# Patient Record
Sex: Female | Born: 1965 | Race: White | Hispanic: No | Smoking: Never smoker
Health system: Southern US, Community
[De-identification: ages and names within clinical notes are randomized; demographics above are authoritative.]

## PROBLEM LIST (undated history)

## (undated) DIAGNOSIS — G4700 Insomnia, unspecified: Secondary | ICD-10-CM

## (undated) DIAGNOSIS — R42 Dizziness and giddiness: Secondary | ICD-10-CM

## (undated) DIAGNOSIS — I10 Essential (primary) hypertension: Secondary | ICD-10-CM

## (undated) DIAGNOSIS — G43909 Migraine, unspecified, not intractable, without status migrainosus: Secondary | ICD-10-CM

## (undated) DIAGNOSIS — F419 Anxiety disorder, unspecified: Secondary | ICD-10-CM

## (undated) DIAGNOSIS — R51 Headache: Secondary | ICD-10-CM

## (undated) DIAGNOSIS — I639 Cerebral infarction, unspecified: Secondary | ICD-10-CM

## (undated) DIAGNOSIS — G459 Transient cerebral ischemic attack, unspecified: Secondary | ICD-10-CM

## (undated) DIAGNOSIS — R112 Nausea with vomiting, unspecified: Secondary | ICD-10-CM

## (undated) DIAGNOSIS — Z9889 Other specified postprocedural states: Secondary | ICD-10-CM

## (undated) DIAGNOSIS — E7800 Pure hypercholesterolemia, unspecified: Secondary | ICD-10-CM

## (undated) HISTORY — PX: SHOULDER SURGERY: SHX246

## (undated) HISTORY — PX: WRIST FRACTURE SURGERY: SHX121

---
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
## 1997-07-05 ENCOUNTER — Encounter: Admission: RE | Admit: 1997-07-05 | Discharge: 1997-10-03 | Payer: Self-pay | Admitting: Family Medicine

## 1998-08-24 ENCOUNTER — Encounter: Payer: Self-pay | Admitting: Family Medicine

## 1998-08-24 ENCOUNTER — Ambulatory Visit (HOSPITAL_COMMUNITY): Admission: RE | Admit: 1998-08-24 | Discharge: 1998-08-24 | Payer: Self-pay | Admitting: Family Medicine

## 1999-01-20 DIAGNOSIS — I639 Cerebral infarction, unspecified: Secondary | ICD-10-CM

## 1999-01-20 HISTORY — DX: Cerebral infarction, unspecified: I63.9

## 1999-03-11 ENCOUNTER — Encounter: Admission: RE | Admit: 1999-03-11 | Discharge: 1999-06-09 | Payer: Self-pay | Admitting: Anesthesiology

## 1999-03-24 ENCOUNTER — Ambulatory Visit (HOSPITAL_COMMUNITY): Admission: RE | Admit: 1999-03-24 | Discharge: 1999-03-24 | Payer: Self-pay | Admitting: Neurosurgery

## 1999-03-24 ENCOUNTER — Encounter: Payer: Self-pay | Admitting: Neurosurgery

## 1999-04-26 ENCOUNTER — Encounter: Payer: Self-pay | Admitting: Neurology

## 1999-04-26 ENCOUNTER — Inpatient Hospital Stay (HOSPITAL_COMMUNITY): Admission: EM | Admit: 1999-04-26 | Discharge: 1999-05-01 | Payer: Self-pay | Admitting: Emergency Medicine

## 1999-04-28 ENCOUNTER — Encounter: Payer: Self-pay | Admitting: Neurology

## 1999-05-06 ENCOUNTER — Ambulatory Visit (HOSPITAL_COMMUNITY)
Admission: RE | Admit: 1999-05-06 | Discharge: 1999-05-06 | Payer: Self-pay | Admitting: Physical Medicine and Rehabilitation

## 1999-05-06 ENCOUNTER — Encounter: Payer: Self-pay | Admitting: Physical Medicine and Rehabilitation

## 1999-05-29 ENCOUNTER — Encounter: Payer: Self-pay | Admitting: Orthopedic Surgery

## 1999-05-29 ENCOUNTER — Ambulatory Visit (HOSPITAL_COMMUNITY): Admission: RE | Admit: 1999-05-29 | Discharge: 1999-05-29 | Payer: Self-pay | Admitting: Orthopedic Surgery

## 1999-06-02 ENCOUNTER — Ambulatory Visit (HOSPITAL_COMMUNITY): Admission: RE | Admit: 1999-06-02 | Discharge: 1999-06-02 | Payer: Self-pay | Admitting: Neurology

## 1999-06-02 ENCOUNTER — Encounter: Payer: Self-pay | Admitting: Neurology

## 1999-07-28 ENCOUNTER — Encounter: Admission: RE | Admit: 1999-07-28 | Discharge: 1999-10-26 | Payer: Self-pay | Admitting: Anesthesiology

## 1999-09-03 ENCOUNTER — Ambulatory Visit (HOSPITAL_BASED_OUTPATIENT_CLINIC_OR_DEPARTMENT_OTHER): Admission: RE | Admit: 1999-09-03 | Discharge: 1999-09-03 | Payer: Self-pay | Admitting: Orthopedic Surgery

## 2000-07-11 ENCOUNTER — Ambulatory Visit (HOSPITAL_COMMUNITY): Admission: RE | Admit: 2000-07-11 | Discharge: 2000-07-11 | Payer: Self-pay | Admitting: Neurology

## 2000-07-11 ENCOUNTER — Encounter: Payer: Self-pay | Admitting: Neurology

## 2000-09-27 ENCOUNTER — Other Ambulatory Visit: Admission: RE | Admit: 2000-09-27 | Discharge: 2000-09-27 | Payer: Self-pay | Admitting: Obstetrics and Gynecology

## 2000-11-03 ENCOUNTER — Ambulatory Visit (HOSPITAL_BASED_OUTPATIENT_CLINIC_OR_DEPARTMENT_OTHER): Admission: RE | Admit: 2000-11-03 | Discharge: 2000-11-03 | Payer: Self-pay | Admitting: Orthopedic Surgery

## 2001-04-19 ENCOUNTER — Encounter: Payer: Self-pay | Admitting: Family Medicine

## 2001-04-19 ENCOUNTER — Ambulatory Visit (HOSPITAL_COMMUNITY): Admission: RE | Admit: 2001-04-19 | Discharge: 2001-04-19 | Payer: Self-pay | Admitting: Family Medicine

## 2001-10-04 ENCOUNTER — Encounter: Payer: Self-pay | Admitting: Emergency Medicine

## 2001-10-04 ENCOUNTER — Emergency Department (HOSPITAL_COMMUNITY): Admission: EM | Admit: 2001-10-04 | Discharge: 2001-10-04 | Payer: Self-pay | Admitting: Emergency Medicine

## 2004-08-15 ENCOUNTER — Other Ambulatory Visit: Admission: RE | Admit: 2004-08-15 | Discharge: 2004-08-15 | Payer: Self-pay | Admitting: Obstetrics and Gynecology

## 2011-05-28 ENCOUNTER — Encounter (HOSPITAL_COMMUNITY): Payer: Self-pay

## 2011-06-02 NOTE — Progress Notes (Signed)
Deborah Wolfe  DICTATION # [id number] CSN# [id number]   Meriel Pica, MD 06/02/2011 10:49 AM

## 2011-06-02 NOTE — H&P (Signed)
Deborah Wolfe, PUELLO NO.:  [id number]  MEDICAL RECORD NO.:  [id number]  LOCATION:  [facility]                         FACILITY:  [facility]  PHYSICIAN:  Duke Salvia. Marcelle Overlie, M.D.DATE OF BIRTH:  12/09/1965  DATE OF ADMISSION:  05/14/2011 DATE OF DISCHARGE:                             HISTORY & PHYSICAL   CHIEF COMPLAINT:  Endometrial polyp, abnormal uterine bleeding.  HISTORY OF PRESENT ILLNESS:  A 46 year old G2, P2, prior tubal,  has a 38 and 47 year old who saw our nurse practitioner recently, complaining of some abnormal uterine bleeding.  Saline infusion ultrasound was performed May 11, 2011 in our office that showed a 6 mm intramural fibroids, very small, possible adenomyosis based on myometrial thickness and a well-defined 12 mm polyp on saline infusion.  She presents now for D and C, hysteroscopy.  This procedure including risks related to bleeding infection, other complications that may require additional surgery all reviewed with her which she understands and accepts.  PAST HISTORY:  Allergies:  None.  CURRENT MEDICATIONS:  Propranolol, baby aspirin daily, verapamil, loratadine, topiramate, pravastatin, and calcium supplements.  SURGICAL HISTORY:  Cesarean section x2 in 1994 and 1998.  FAMILY HISTORY:  Significant for heart disease, diabetes, hypertension, and lung and cervical cancer.  SOCIAL HISTORY:  Denies alcohol, tobacco, or drug use.  She is separated.  Last Pap April 27, 2011 was negative.  April 12, she had a colposcopy and biopsy that showed benign squamous mucosa.  No dysplasia noted with a negative ECC.  PHYSICAL EXAMINATION:  VITAL SIGNS:  Temp 98.2, blood pressure 110/80. HEENT:  Unremarkable. NECK:  Supple without masses. LUNGS:  Clear. CARDIOVASCULAR:  Regular rate and rhythm without murmurs, rubs, gallops. BREASTS:  Without masses. ABDOMEN:  Soft, flat, nontender. PELVIC:  Normal external genitalia.  Vagina and cervix clear.  Uterus  is mid position, normal size.  Adnexa negative. EXTREMITIES AND NEUROLOGIC:  Unremarkable.  IMPRESSION:  Abnormal uterine bleeding/endometrial polyp.  PLAN:  D and C, hysteroscopy.  Procedure and risks reviewed as above.     Austine Wiedeman M. Marcelle Overlie, M.D.     [facility]/MEDQ  D:  06/02/2011  T:  06/02/2011  Job:  [id number]

## 2011-06-04 ENCOUNTER — Other Ambulatory Visit: Payer: Self-pay | Admitting: Neurology

## 2011-06-04 DIAGNOSIS — IMO0002 Reserved for concepts with insufficient information to code with codable children: Secondary | ICD-10-CM

## 2011-06-05 ENCOUNTER — Ambulatory Visit
Admission: RE | Admit: 2011-06-05 | Discharge: 2011-06-05 | Disposition: A | Discharge status: Home / Self Care | Payer: BC Managed Care – PPO | Source: Ambulatory Visit | Attending: Neurology | Admitting: Neurology

## 2011-06-05 VITALS — BP 141/82 | HR 75

## 2011-06-05 DIAGNOSIS — H60399 Other infective otitis externa, unspecified ear: Secondary | ICD-10-CM

## 2011-06-05 DIAGNOSIS — IMO0002 Reserved for concepts with insufficient information to code with codable children: Secondary | ICD-10-CM

## 2011-06-05 NOTE — Discharge Instructions (Signed)

## 2011-06-05 NOTE — Progress Notes (Signed)
Blood drawn for labs per orders.jkl

## 2011-06-08 ENCOUNTER — Encounter (HOSPITAL_COMMUNITY): Payer: Self-pay | Admitting: *Deleted

## 2011-06-11 LAB — CSF PANEL II
Glucose, CSF: 59 mg/dL (ref 43–76)
Total Protein, CSF: 44 mg/dL (ref 15–45)
Tube #: 3

## 2011-06-12 ENCOUNTER — Ambulatory Visit (HOSPITAL_COMMUNITY): Payer: BC Managed Care – PPO | Admitting: Anesthesiology

## 2011-06-12 ENCOUNTER — Other Ambulatory Visit: Payer: Self-pay

## 2011-06-12 ENCOUNTER — Encounter (HOSPITAL_COMMUNITY): Payer: Self-pay | Admitting: Obstetrics and Gynecology

## 2011-06-12 ENCOUNTER — Ambulatory Visit (HOSPITAL_COMMUNITY)
Admission: RE | Admit: 2011-06-12 | Discharge: 2011-06-12 | Disposition: A | Discharge status: Home / Self Care | Payer: BC Managed Care – PPO | Source: Ambulatory Visit | Attending: Obstetrics and Gynecology | Admitting: Obstetrics and Gynecology

## 2011-06-12 ENCOUNTER — Encounter (HOSPITAL_COMMUNITY)
Admission: RE | Disposition: A | Discharge status: Home / Self Care | Payer: Self-pay | Source: Ambulatory Visit | Attending: Obstetrics and Gynecology

## 2011-06-12 ENCOUNTER — Encounter (HOSPITAL_COMMUNITY): Payer: Self-pay | Admitting: Anesthesiology

## 2011-06-12 DIAGNOSIS — N949 Unspecified condition associated with female genital organs and menstrual cycle: Secondary | ICD-10-CM | POA: Insufficient documentation

## 2011-06-12 DIAGNOSIS — N840 Polyp of corpus uteri: Secondary | ICD-10-CM | POA: Insufficient documentation

## 2011-06-12 DIAGNOSIS — N938 Other specified abnormal uterine and vaginal bleeding: Secondary | ICD-10-CM | POA: Insufficient documentation

## 2011-06-12 HISTORY — DX: Essential (primary) hypertension: I10

## 2011-06-12 HISTORY — DX: Headache: R51

## 2011-06-12 HISTORY — DX: Dizziness and giddiness: R42

## 2011-06-12 HISTORY — DX: Other specified postprocedural states: R11.2

## 2011-06-12 HISTORY — DX: Insomnia, unspecified: G47.00

## 2011-06-12 HISTORY — DX: Pure hypercholesterolemia, unspecified: E78.00

## 2011-06-12 HISTORY — DX: Cerebral infarction, unspecified: I63.9

## 2011-06-12 HISTORY — DX: Nausea with vomiting, unspecified: Z98.890

## 2011-06-12 HISTORY — PX: POLYPECTOMY: SHX5525

## 2011-06-12 HISTORY — PX: HYSTEROSCOPY WITH D & C: SHX1775

## 2011-06-12 HISTORY — DX: Migraine, unspecified, not intractable, without status migrainosus: G43.909

## 2011-06-12 LAB — CBC
HCT: 40.4 % (ref 36.0–46.0)
Hemoglobin: 13.2 g/dL (ref 12.0–15.0)
MCH: 28.9 pg (ref 26.0–34.0)
MCV: 88.4 fL (ref 78.0–100.0)
Platelets: 309 10*3/uL (ref 150–400)
RBC: 4.57 MIL/uL (ref 3.87–5.11)
WBC: 11.1 10*3/uL — ABNORMAL HIGH (ref 4.0–10.5)

## 2011-06-12 SURGERY — DILATATION AND CURETTAGE /HYSTEROSCOPY
Anesthesia: General | Site: Uterus | Wound class: Clean Contaminated

## 2011-06-12 MED ORDER — CEFAZOLIN SODIUM 1-5 GM-% IV SOLN
INTRAVENOUS | Status: AC
  Administered 2011-06-12: 1 g via INTRAVENOUS
  Filled 2011-06-12: qty 50

## 2011-06-12 MED ORDER — LIDOCAINE HCL (CARDIAC) 20 MG/ML IV SOLN
INTRAVENOUS | Status: AC
  Filled 2011-06-12: qty 5

## 2011-06-12 MED ORDER — LACTATED RINGERS IV SOLN
INTRAVENOUS | Status: DC
  Administered 2011-06-12 (×2): via INTRAVENOUS

## 2011-06-12 MED ORDER — PROPOFOL 10 MG/ML IV EMUL
INTRAVENOUS | Status: DC | PRN
  Administered 2011-06-12: 200 mg via INTRAVENOUS

## 2011-06-12 MED ORDER — KETOROLAC TROMETHAMINE 30 MG/ML IJ SOLN: 15.0000 mg | Freq: Once | INTRAMUSCULAR | Status: DC | PRN

## 2011-06-12 MED ORDER — ONDANSETRON HCL 4 MG/2ML IJ SOLN: 4.0000 mg | Freq: Once | INTRAMUSCULAR | Status: DC | PRN

## 2011-06-12 MED ORDER — KETOROLAC TROMETHAMINE 30 MG/ML IJ SOLN
INTRAMUSCULAR | Status: AC
  Filled 2011-06-12: qty 1

## 2011-06-12 MED ORDER — PHENYLEPHRINE HCL 10 MG/ML IJ SOLN
INTRAMUSCULAR | Status: DC | PRN
  Administered 2011-06-12 (×2): 80 ug via INTRAVENOUS
  Administered 2011-06-12: 40 ug via INTRAVENOUS

## 2011-06-12 MED ORDER — KETOROLAC TROMETHAMINE 30 MG/ML IJ SOLN
INTRAMUSCULAR | Status: DC | PRN
  Administered 2011-06-12: 30 mg via INTRAVENOUS

## 2011-06-12 MED ORDER — FENTANYL CITRATE 0.05 MG/ML IJ SOLN
INTRAMUSCULAR | Status: AC
  Filled 2011-06-12: qty 5

## 2011-06-12 MED ORDER — MIDAZOLAM HCL 2 MG/2ML IJ SOLN
INTRAMUSCULAR | Status: AC
  Filled 2011-06-12: qty 2

## 2011-06-12 MED ORDER — ONDANSETRON HCL 4 MG/2ML IJ SOLN
INTRAMUSCULAR | Status: AC
  Filled 2011-06-12: qty 2

## 2011-06-12 MED ORDER — ONDANSETRON HCL 4 MG/2ML IJ SOLN
INTRAMUSCULAR | Status: DC | PRN
  Administered 2011-06-12: 4 mg via INTRAVENOUS

## 2011-06-12 MED ORDER — SODIUM CHLORIDE 0.9 % IR SOLN
Status: DC | PRN
  Administered 2011-06-12: 3000 mL

## 2011-06-12 MED ORDER — FENTANYL CITRATE 0.05 MG/ML IJ SOLN
INTRAMUSCULAR | Status: AC
  Administered 2011-06-12: 50 ug via INTRAVENOUS
  Filled 2011-06-12: qty 2

## 2011-06-12 MED ORDER — FENTANYL CITRATE 0.05 MG/ML IJ SOLN
25.0000 ug | INTRAMUSCULAR | Status: DC | PRN
  Administered 2011-06-12: 50 ug via INTRAVENOUS

## 2011-06-12 MED ORDER — MIDAZOLAM HCL 5 MG/5ML IJ SOLN
INTRAMUSCULAR | Status: DC | PRN
  Administered 2011-06-12: 2 mg via INTRAVENOUS

## 2011-06-12 MED ORDER — PROMETHAZINE HCL 25 MG RE SUPP: 12.5000 mg | Freq: Once | RECTAL | Status: DC | PRN

## 2011-06-12 MED ORDER — LIDOCAINE HCL 1 % IJ SOLN
INTRAMUSCULAR | Status: DC | PRN
  Administered 2011-06-12: 10 mL

## 2011-06-12 MED ORDER — MEPERIDINE HCL 25 MG/ML IJ SOLN: 6.2500 mg | INTRAMUSCULAR | Status: DC | PRN

## 2011-06-12 MED ORDER — FENTANYL CITRATE 0.05 MG/ML IJ SOLN
INTRAMUSCULAR | Status: DC | PRN
  Administered 2011-06-12: 50 ug via INTRAVENOUS
  Administered 2011-06-12: 100 ug via INTRAVENOUS

## 2011-06-12 MED ORDER — PROPOFOL 10 MG/ML IV EMUL
INTRAVENOUS | Status: AC
  Filled 2011-06-12: qty 20

## 2011-06-12 MED ORDER — PHENYLEPHRINE 40 MCG/ML (10ML) SYRINGE FOR IV PUSH (FOR BLOOD PRESSURE SUPPORT)
PREFILLED_SYRINGE | INTRAVENOUS | Status: AC
  Filled 2011-06-12: qty 5

## 2011-06-12 MED ORDER — DEXAMETHASONE SODIUM PHOSPHATE 4 MG/ML IJ SOLN
INTRAMUSCULAR | Status: DC | PRN
  Administered 2011-06-12: 10 mg via INTRAVENOUS

## 2011-06-12 MED ORDER — LIDOCAINE HCL (CARDIAC) 20 MG/ML IV SOLN
INTRAVENOUS | Status: DC | PRN
  Administered 2011-06-12: 80 mg via INTRAVENOUS

## 2011-06-12 MED ORDER — CEFAZOLIN SODIUM 1-5 GM-% IV SOLN: 1.0000 g | INTRAVENOUS | Status: DC

## 2011-06-12 MED ORDER — HYDROCODONE-IBUPROFEN 7.5-200 MG PO TABS: 1.0000 | ORAL_TABLET | Freq: Four times a day (QID) | ORAL | Status: AC | PRN

## 2011-06-12 SURGICAL SUPPLY — 16 items
BLADE INCISOR TRUC PLUS 2.9 (ABLATOR) IMPLANT
CANISTER SUCTION 2500CC (MISCELLANEOUS) ×3 IMPLANT
CATH ROBINSON RED A/P 16FR (CATHETERS) ×3 IMPLANT
CLOTH BEACON ORANGE TIMEOUT ST (SAFETY) ×3 IMPLANT
CONTAINER PREFILL 10% NBF 60ML (FORM) ×5 IMPLANT
ELECT REM PT RETURN 9FT ADLT (ELECTROSURGICAL)
ELECTRODE REM PT RTRN 9FT ADLT (ELECTROSURGICAL) IMPLANT
GLOVE BIO SURGEON STRL SZ7 (GLOVE) ×6 IMPLANT
GOWN PREVENTION PLUS LG XLONG (DISPOSABLE) ×6 IMPLANT
GOWN STRL REIN XL XLG (GOWN DISPOSABLE) ×3 IMPLANT
INCISOR TRUC PLUS BLADE 2.9 (ABLATOR) ×3
KIT HYSTEROSCOPY TRUCLEAR (ABLATOR) ×1 IMPLANT
LOOP ANGLED CUTTING 22FR (CUTTING LOOP) IMPLANT
PACK HYSTEROSCOPY LF (CUSTOM PROCEDURE TRAY) ×3 IMPLANT
TOWEL OR 17X24 6PK STRL BLUE (TOWEL DISPOSABLE) ×6 IMPLANT
WATER STERILE IRR 1000ML POUR (IV SOLUTION) ×3 IMPLANT

## 2011-06-12 NOTE — Anesthesia Preprocedure Evaluation (Addendum)
Anesthesia Evaluation  Patient identified by MRN, date of birth, ID band Patient awake    Reviewed: Allergy & Precautions, H&P , Patient's Chart, lab work & pertinent test results, reviewed documented beta blocker date and time   History of Anesthesia Complications (+) PONV  Airway Mallampati: II TM Distance: >3 FB Neck ROM: full    Dental No notable dental hx.    Pulmonary  breath sounds clear to auscultation  Pulmonary exam normal       Cardiovascular hypertension (EKG nl today), Pt. on medications and Pt. on home beta blockers Rhythm:regular Rate:Normal     Neuro/Psych  Headaches, CVA (L hemiparesis-resolved), No Residual Symptoms    GI/Hepatic   Endo/Other    Renal/GU      Musculoskeletal   Abdominal   Peds  Hematology   Anesthesia Other Findings   Reproductive/Obstetrics                        Anesthesia Physical Anesthesia Plan  ASA: II  Anesthesia Plan: General   Post-op Pain Management:    Induction: Intravenous  Airway Management Planned: LMA  Additional Equipment:   Intra-op Plan:   Post-operative Plan:   Informed Consent: I have reviewed the patients History and Physical, chart, labs and discussed the procedure including the risks, benefits and alternatives for the proposed anesthesia with the patient or authorized representative who has indicated his/her understanding and acceptance.   Dental Advisory Given  Plan Discussed with: CRNA and Surgeon  Anesthesia Plan Comments: (  Discussed  general anesthesia, including possible nausea, instrumentation of airway, sore throat,pulmonary aspiration, etc. I asked if the were any outstanding questions, or  concerns before we proceeded. )        Anesthesia Quick Evaluation

## 2011-06-12 NOTE — Anesthesia Postprocedure Evaluation (Signed)
Anesthesia Post Note  Patient: Deborah Wolfe  Procedure(s) Performed: Procedure(s) (LRB): DILATATION AND CURETTAGE /HYSTEROSCOPY (N/A) POLYPECTOMY ()  Anesthesia type: General  Patient location: PACU  Post pain: Pain level controlled  Post assessment: Post-op Vital signs reviewed  Last Vitals:  Filed Vitals:   06/12/11 0800  BP: 118/75  Pulse: 81  Temp: 36.8 C  Resp: 20    Post vital signs: Reviewed  Level of consciousness: sedated  Complications: No apparent anesthesia complicationsfj

## 2011-06-12 NOTE — Progress Notes (Signed)
The patient was re-examined with no change in status 

## 2011-06-12 NOTE — Op Note (Signed)
Preoperative diagnosis: Abnormal uterine bleeding, endometrial polyp  Postoperative diagnosis: Same  Procedure: D&C, hysteroscopy, to clear resection of endometrial polyps  Surgeon: Marcelle Overlie  Complications: None  EBL: Minimal  Specimens removed: Interatrial polyps, to pathology  Procedure and findings:  Patient taken the operating room after an adequate level of general anesthesia was obtained with the patient's legs in stirrups the perineum and vagina were prepped and draped in the usual fashion for D&C hysteroscopy. This point a timeout was conducted. The bladder had previously been drained EUA carried out uterus was midposition normal size mobile adnexa negative. Cervix was grasped with tenaculum after speculum was positioned. Paracervical block was then created by infiltrating at 3 and 9:00 submucosally 5-7 cc of 1% Xylocaine at each site after negative aspiration. The uterus was then sounded to 8 cm progressively dilated to a 27-29 Pratt dilator. Into clear hysteroscope, the 5 mm scope, was inserted under continuous flow into polyps were noted one larger than the other. The Tru Clear resector was in position and under direct guidance with irrigation suction both polyps were resected completely. At this point the fundus was free and clear there were no other abnormalities is was well-tolerated the fluid deficit was basically 0 introducer removed. Patient was doing well at that point Toradol was given IV and she went to PACU in good condition.  Dictated with [facility] Bronda Alfred M. Milana Obey.D.

## 2011-06-12 NOTE — Transfer of Care (Signed)
Immediate Anesthesia Transfer of Care Note  Patient: Deborah Wolfe  Procedure(s) Performed: Procedure(s) (LRB): DILATATION AND CURETTAGE /HYSTEROSCOPY (N/A) POLYPECTOMY ()  Patient Location: PACU  Anesthesia Type: General  Level of Consciousness: awake, alert  and oriented  Airway & Oxygen Therapy: Patient Spontanous Breathing and Patient connected to nasal cannula oxygen  Post-op Assessment: Report given to PACU RN and Post -op Vital signs reviewed and stable  Post vital signs: stable  Complications: No apparent anesthesia complications

## 2011-06-12 NOTE — Discharge Instructions (Signed)

## 2011-10-14 NOTE — H&P (Signed)
Rubie Maid  DICTATION # [id number] CSN# [id number]   Meriel Pica, MD 10/14/2011 12:34 PM

## 2011-10-14 NOTE — H&P (Signed)
Deborah Wolfe, Deborah Wolfe NO.:  [id number]  MEDICAL RECORD NO.:  [id number]  LOCATION:  [facility]                         [facility]ACILITY:  [facility]  PHYSICIAN:  Duke Salvia. Marcelle Overlie, M.D.DATE OF BIRTH:  [birth year]  DATE OF ADMISSION:  10/08/2011 DATE OF DISCHARGE:                             HISTORY & PHYSICAL   CHIEF COMPLAINT:  Menorrhagia.  HISTORY OF PRESENT ILLNESS:  A 46 year old G2, P2, prior tubal with a 38 and an 47 year old, in the spring of this year she had complaints of menorrhagia and underwent FHT that demonstrated 2 well-defined polyps with several small intramural fibroids and possible adenomyosis based on thickened endometrium.  She underwent D and C, hysteroscopy May 2013 with removal of benign endometrial polyps by pathology, did well initially but continued to experience worsening problems with menorrhagia.  We discussed a number of options including OCPs, Mirena IUD, endometrial ablation, or definitive LAVH.  She prefers definitive treatment, the hysterectomy procedure reviewed including risks related to bleeding, infection, transfusion, adjacent organ injury, possible need to complete surgery by open technique along with her expected recovery time.  PAST MEDICAL HISTORY:  Allergies:  None.  CURRENT MEDICATIONS:  Propranolol, baby aspirin, verapamil, loratadine, topiramate, pravastatin, and calcium supplements.  SURGICAL HISTORY:  Cesarean section x2 in 1994 and 1998.  FAMILY HISTORY:  Significant for heart disease, diabetes, hypertension, lung and cervical cancer.  SOCIAL HISTORY:  Denies alcohol, tobacco, or drug use.  She is separated.  Last Pap April 21, 2011 was normal.  REVIEW OF SYSTEMS:  She has had pyelonephritis in the past, history of interstitial cystitis in the past also.  Her neurologist is Dr. Sandria Manly, who has had her on baby aspirin for history of mini-stroke.  PHYSICAL EXAMINATION:  VITAL SIGNS:  Temperature 98.2, blood  pressure 120/78. HEENT:  Unremarkable. NECK:  Supple without masses. LUNGS:  Clear. CARDIOVASCULAR:  Regular rate and rhythm without murmurs, rubs, gallops. BREASTS:  Without masses. ABDOMEN:  Soft, flat, nontender. PELVIC:  Normal external genitalia.  Vagina and cervix clear.  Uterus, mid position, normal size, mobile, adnexa negative. EXTREMITIES AND NEUROLOGIC:  Unremarkable.  IMPRESSION:  Persistent menorrhagia, possible adenomyosis, small intramural fibroids noted on ultrasound.  PLAN:  LAVH procedure and risks discussed as above.     [name] M. Marcelle Overlie, M.D.     [facility]/MEDQ  D:  10/14/2011  T:  10/14/2011  Job:  [id number]

## 2011-10-16 ENCOUNTER — Encounter (HOSPITAL_COMMUNITY): Payer: Self-pay

## 2011-10-16 ENCOUNTER — Encounter (HOSPITAL_COMMUNITY)
Admission: RE | Admit: 2011-10-16 | Discharge: 2011-10-16 | Disposition: A | Discharge status: Home / Self Care | Payer: BC Managed Care – PPO | Source: Ambulatory Visit | Attending: Obstetrics and Gynecology | Admitting: Obstetrics and Gynecology

## 2011-10-16 HISTORY — DX: Anxiety disorder, unspecified: F41.9

## 2011-10-16 HISTORY — DX: Transient cerebral ischemic attack, unspecified: G45.9

## 2011-10-16 LAB — CBC
MCH: 29.2 pg (ref 26.0–34.0)
MCHC: 32.9 g/dL (ref 30.0–36.0)
RDW: 12.6 % (ref 11.5–15.5)

## 2011-10-16 LAB — SURGICAL PCR SCREEN
MRSA, PCR: NEGATIVE
Staphylococcus aureus: NEGATIVE

## 2011-10-16 NOTE — Patient Instructions (Addendum)
   Your procedure is scheduled on:  Monday October 7th  Enter through the [employer] of [facility] at: 6 am Pick up the phone at the desk and dial [phone] and inform us of your arrival.  Please call this number if you have any problems the morning of surgery: [phone]  Remember: Do not eat or drink anything after midnight on Sunday  You may take all of your medications with sips of water morning of surgery.  Do not wear jewelry, make-up, or FINGER nail polish No metal in your hair or on your body. Do not wear lotions, powders, perfumes. You may wear deodorant.  Please use your CHG wash as directed prior to surgery.  Do not shave anywhere for at least 12 hours prior to first CHG shower.  Do not bring valuables to the hospital.   Leave suitcase in the car. After Surgery it may be brought to your room. For patients being admitted to the hospital, checkout time is 11:00am the day of discharge.

## 2011-10-26 ENCOUNTER — Encounter (HOSPITAL_COMMUNITY)
Admission: RE | Disposition: A | Discharge status: Home / Self Care | Payer: Self-pay | Source: Ambulatory Visit | Attending: Obstetrics and Gynecology

## 2011-10-26 ENCOUNTER — Ambulatory Visit (HOSPITAL_COMMUNITY): Payer: BC Managed Care – PPO | Admitting: Anesthesiology

## 2011-10-26 ENCOUNTER — Encounter (HOSPITAL_COMMUNITY): Payer: Self-pay | Admitting: Anesthesiology

## 2011-10-26 ENCOUNTER — Encounter (HOSPITAL_COMMUNITY): Payer: Self-pay | Admitting: *Deleted

## 2011-10-26 ENCOUNTER — Ambulatory Visit (HOSPITAL_COMMUNITY)
Admission: RE | Admit: 2011-10-26 | Discharge: 2011-10-27 | Disposition: A | Discharge status: Home / Self Care | Payer: BC Managed Care – PPO | Source: Ambulatory Visit | Attending: Obstetrics and Gynecology | Admitting: Obstetrics and Gynecology

## 2011-10-26 ENCOUNTER — Encounter (HOSPITAL_COMMUNITY): Payer: Self-pay

## 2011-10-26 DIAGNOSIS — D251 Intramural leiomyoma of uterus: Secondary | ICD-10-CM | POA: Insufficient documentation

## 2011-10-26 DIAGNOSIS — N8000 Endometriosis of the uterus, unspecified: Secondary | ICD-10-CM | POA: Insufficient documentation

## 2011-10-26 DIAGNOSIS — N946 Dysmenorrhea, unspecified: Secondary | ICD-10-CM | POA: Insufficient documentation

## 2011-10-26 DIAGNOSIS — N920 Excessive and frequent menstruation with regular cycle: Secondary | ICD-10-CM | POA: Insufficient documentation

## 2011-10-26 DIAGNOSIS — D250 Submucous leiomyoma of uterus: Secondary | ICD-10-CM | POA: Insufficient documentation

## 2011-10-26 HISTORY — PX: LAPAROSCOPIC ASSISTED VAGINAL HYSTERECTOMY: SHX5398

## 2011-10-26 LAB — PREGNANCY, URINE: Preg Test, Ur: NEGATIVE

## 2011-10-26 SURGERY — HYSTERECTOMY, VAGINAL, LAPAROSCOPY-ASSISTED
Anesthesia: General | Site: Abdomen | Wound class: Clean Contaminated

## 2011-10-26 MED ORDER — NEOSTIGMINE METHYLSULFATE 1 MG/ML IJ SOLN
INTRAMUSCULAR | Status: AC
  Filled 2011-10-26: qty 10

## 2011-10-26 MED ORDER — IBUPROFEN 800 MG PO TABS
800.0000 mg | ORAL_TABLET | Freq: Three times a day (TID) | ORAL | Status: DC | PRN
  Administered 2011-10-27: 800 mg via ORAL
  Filled 2011-10-26: qty 1

## 2011-10-26 MED ORDER — ACETAMINOPHEN 10 MG/ML IV SOLN
1000.0000 mg | Freq: Once | INTRAVENOUS | Status: AC
  Administered 2011-10-26 (×2): 1000 mg via INTRAVENOUS

## 2011-10-26 MED ORDER — CEFAZOLIN SODIUM-DEXTROSE 2-3 GM-% IV SOLR
2.0000 g | INTRAVENOUS | Status: AC
  Administered 2011-10-26: 2 g via INTRAVENOUS

## 2011-10-26 MED ORDER — KETOROLAC TROMETHAMINE 30 MG/ML IJ SOLN
30.0000 mg | Freq: Once | INTRAMUSCULAR | Status: DC
  Filled 2011-10-26 (×3): qty 1

## 2011-10-26 MED ORDER — GLYCOPYRROLATE 0.2 MG/ML IJ SOLN
INTRAMUSCULAR | Status: DC | PRN
  Administered 2011-10-26: 0.4 mg via INTRAVENOUS

## 2011-10-26 MED ORDER — NALOXONE HCL 0.4 MG/ML IJ SOLN: 0.4000 mg | INTRAMUSCULAR | Status: DC | PRN

## 2011-10-26 MED ORDER — KETOROLAC TROMETHAMINE 30 MG/ML IJ SOLN: 30.0000 mg | Freq: Four times a day (QID) | INTRAMUSCULAR | Status: DC

## 2011-10-26 MED ORDER — DEXTROSE IN LACTATED RINGERS 5 % IV SOLN
INTRAVENOUS | Status: DC
  Administered 2011-10-26 (×2): via INTRAVENOUS

## 2011-10-26 MED ORDER — SCOPOLAMINE 1 MG/3DAYS TD PT72
MEDICATED_PATCH | TRANSDERMAL | Status: AC
  Administered 2011-10-26: 1.5 mg via TRANSDERMAL
  Filled 2011-10-26: qty 1

## 2011-10-26 MED ORDER — OXYCODONE-ACETAMINOPHEN 5-325 MG PO TABS
1.0000 | ORAL_TABLET | ORAL | Status: DC | PRN
  Administered 2011-10-27: 2 via ORAL
  Filled 2011-10-26 (×2): qty 2

## 2011-10-26 MED ORDER — LACTATED RINGERS IR SOLN
Status: DC | PRN
  Administered 2011-10-26: 3000 mL

## 2011-10-26 MED ORDER — BUPIVACAINE HCL (PF) 0.25 % IJ SOLN
INTRAMUSCULAR | Status: DC | PRN
  Administered 2011-10-26: 5 mL

## 2011-10-26 MED ORDER — GLYCOPYRROLATE 0.2 MG/ML IJ SOLN
INTRAMUSCULAR | Status: AC
  Filled 2011-10-26: qty 2

## 2011-10-26 MED ORDER — DEXAMETHASONE SODIUM PHOSPHATE 4 MG/ML IJ SOLN
INTRAMUSCULAR | Status: DC | PRN
  Administered 2011-10-26: 10 mg via INTRAVENOUS

## 2011-10-26 MED ORDER — HYDROMORPHONE HCL PF 1 MG/ML IJ SOLN
INTRAMUSCULAR | Status: AC
  Administered 2011-10-26: 0.5 mg via INTRAVENOUS
  Filled 2011-10-26: qty 1

## 2011-10-26 MED ORDER — VERAPAMIL HCL 40 MG PO TABS
40.0000 mg | ORAL_TABLET | Freq: Three times a day (TID) | ORAL | Status: DC
  Filled 2011-10-26 (×3): qty 1

## 2011-10-26 MED ORDER — SODIUM CHLORIDE 0.9 % IJ SOLN: 9.0000 mL | INTRAMUSCULAR | Status: DC | PRN

## 2011-10-26 MED ORDER — PROPOFOL 10 MG/ML IV EMUL
INTRAVENOUS | Status: DC | PRN
  Administered 2011-10-26: 150 mg via INTRAVENOUS

## 2011-10-26 MED ORDER — ONDANSETRON HCL 4 MG PO TABS: 4.0000 mg | ORAL_TABLET | Freq: Four times a day (QID) | ORAL | Status: DC | PRN

## 2011-10-26 MED ORDER — LIDOCAINE HCL (CARDIAC) 20 MG/ML IV SOLN
INTRAVENOUS | Status: DC | PRN
  Administered 2011-10-26: 50 mg via INTRAVENOUS

## 2011-10-26 MED ORDER — ROCURONIUM BROMIDE 100 MG/10ML IV SOLN
INTRAVENOUS | Status: DC | PRN
  Administered 2011-10-26: 40 mg via INTRAVENOUS

## 2011-10-26 MED ORDER — DIPHENHYDRAMINE HCL 50 MG/ML IJ SOLN: 12.5000 mg | Freq: Four times a day (QID) | INTRAMUSCULAR | Status: DC | PRN

## 2011-10-26 MED ORDER — MORPHINE SULFATE (PF) 1 MG/ML IV SOLN
INTRAVENOUS | Status: DC
  Administered 2011-10-26: 11:00:00 via INTRAVENOUS
  Filled 2011-10-26: qty 25

## 2011-10-26 MED ORDER — SIMVASTATIN 40 MG PO TABS
40.0000 mg | ORAL_TABLET | Freq: Every day | ORAL | Status: DC
  Filled 2011-10-26: qty 1

## 2011-10-26 MED ORDER — ONDANSETRON HCL 4 MG/2ML IJ SOLN: 4.0000 mg | Freq: Four times a day (QID) | INTRAMUSCULAR | Status: DC | PRN

## 2011-10-26 MED ORDER — KETOROLAC TROMETHAMINE 30 MG/ML IJ SOLN: 15.0000 mg | Freq: Once | INTRAMUSCULAR | Status: DC | PRN

## 2011-10-26 MED ORDER — EPHEDRINE SULFATE 50 MG/ML IJ SOLN
INTRAMUSCULAR | Status: DC | PRN
  Administered 2011-10-26 (×5): 10 mg via INTRAVENOUS

## 2011-10-26 MED ORDER — TOPIRAMATE 100 MG PO TABS
100.0000 mg | ORAL_TABLET | Freq: Every day | ORAL | Status: DC
  Administered 2011-10-26: 100 mg via ORAL
  Filled 2011-10-26: qty 1

## 2011-10-26 MED ORDER — KETOROLAC TROMETHAMINE 30 MG/ML IJ SOLN
30.0000 mg | Freq: Four times a day (QID) | INTRAMUSCULAR | Status: DC
  Administered 2011-10-26 – 2011-10-27 (×2): 30 mg via INTRAVENOUS

## 2011-10-26 MED ORDER — MIDAZOLAM HCL 2 MG/2ML IJ SOLN
INTRAMUSCULAR | Status: AC
  Filled 2011-10-26: qty 2

## 2011-10-26 MED ORDER — DEXAMETHASONE SODIUM PHOSPHATE 10 MG/ML IJ SOLN
INTRAMUSCULAR | Status: AC
  Filled 2011-10-26: qty 1

## 2011-10-26 MED ORDER — DIPHENHYDRAMINE HCL 12.5 MG/5ML PO ELIX: 12.5000 mg | ORAL_SOLUTION | Freq: Four times a day (QID) | ORAL | Status: DC | PRN

## 2011-10-26 MED ORDER — FENTANYL CITRATE 0.05 MG/ML IJ SOLN
INTRAMUSCULAR | Status: AC
  Filled 2011-10-26: qty 5

## 2011-10-26 MED ORDER — FENTANYL CITRATE 0.05 MG/ML IJ SOLN
INTRAMUSCULAR | Status: DC | PRN
  Administered 2011-10-26: 150 ug via INTRAVENOUS
  Administered 2011-10-26: 100 ug via INTRAVENOUS

## 2011-10-26 MED ORDER — ONDANSETRON HCL 4 MG/2ML IJ SOLN
INTRAMUSCULAR | Status: AC
  Filled 2011-10-26: qty 2

## 2011-10-26 MED ORDER — DEXTROSE IN LACTATED RINGERS 5 % IV SOLN: INTRAVENOUS | Status: DC

## 2011-10-26 MED ORDER — NEOSTIGMINE METHYLSULFATE 1 MG/ML IJ SOLN
INTRAMUSCULAR | Status: DC | PRN
  Administered 2011-10-26: 3 mg via INTRAVENOUS

## 2011-10-26 MED ORDER — ZOLPIDEM TARTRATE 5 MG PO TABS: 5.0000 mg | ORAL_TABLET | Freq: Every evening | ORAL | Status: DC | PRN

## 2011-10-26 MED ORDER — CEFAZOLIN SODIUM-DEXTROSE 2-3 GM-% IV SOLR
INTRAVENOUS | Status: AC
  Filled 2011-10-26: qty 50

## 2011-10-26 MED ORDER — KETOROLAC TROMETHAMINE 30 MG/ML IJ SOLN
INTRAMUSCULAR | Status: DC | PRN
  Administered 2011-10-26: 30 mg via INTRAVENOUS

## 2011-10-26 MED ORDER — HYDROMORPHONE 0.3 MG/ML IV SOLN
INTRAVENOUS | Status: DC
  Administered 2011-10-26: 1.19 mg via INTRAVENOUS
  Administered 2011-10-26: 12:00:00 via INTRAVENOUS
  Administered 2011-10-26: 0.2 mg via INTRAVENOUS
  Filled 2011-10-26: qty 25

## 2011-10-26 MED ORDER — HYDROMORPHONE HCL PF 1 MG/ML IJ SOLN
0.2500 mg | INTRAMUSCULAR | Status: DC | PRN
  Administered 2011-10-26 (×2): 0.5 mg via INTRAVENOUS

## 2011-10-26 MED ORDER — ACETAMINOPHEN 10 MG/ML IV SOLN
INTRAVENOUS | Status: AC
  Administered 2011-10-26: 1000 mg via INTRAVENOUS
  Filled 2011-10-26: qty 100

## 2011-10-26 MED ORDER — ALPRAZOLAM 0.25 MG PO TABS: 0.2500 mg | ORAL_TABLET | Freq: Every day | ORAL | Status: DC | PRN

## 2011-10-26 MED ORDER — ROCURONIUM BROMIDE 50 MG/5ML IV SOLN
INTRAVENOUS | Status: AC
  Filled 2011-10-26: qty 1

## 2011-10-26 MED ORDER — PROPOFOL 10 MG/ML IV EMUL
INTRAVENOUS | Status: AC
  Filled 2011-10-26: qty 20

## 2011-10-26 MED ORDER — SCOPOLAMINE 1 MG/3DAYS TD PT72
1.0000 | MEDICATED_PATCH | TRANSDERMAL | Status: DC
  Administered 2011-10-26: 1.5 mg via TRANSDERMAL

## 2011-10-26 MED ORDER — ONDANSETRON HCL 4 MG/2ML IJ SOLN
INTRAMUSCULAR | Status: DC | PRN
  Administered 2011-10-26: 4 mg via INTRAVENOUS

## 2011-10-26 MED ORDER — SODIUM CHLORIDE 0.9 % IJ SOLN
INTRAMUSCULAR | Status: DC | PRN
  Administered 2011-10-26: 10 mL

## 2011-10-26 MED ORDER — BUTORPHANOL TARTRATE 1 MG/ML IJ SOLN: 1.0000 mg | INTRAMUSCULAR | Status: DC | PRN

## 2011-10-26 MED ORDER — LACTATED RINGERS IV SOLN
INTRAVENOUS | Status: DC
  Administered 2011-10-26: 08:00:00 via INTRAVENOUS
  Administered 2011-10-26: 125 mL/h via INTRAVENOUS

## 2011-10-26 MED ORDER — PROPRANOLOL HCL 40 MG PO TABS
40.0000 mg | ORAL_TABLET | Freq: Two times a day (BID) | ORAL | Status: DC
  Administered 2011-10-26: 40 mg via ORAL
  Filled 2011-10-26 (×2): qty 1

## 2011-10-26 MED ORDER — BUPIVACAINE HCL (PF) 0.25 % IJ SOLN
INTRAMUSCULAR | Status: AC
  Filled 2011-10-26: qty 30

## 2011-10-26 MED ORDER — MIDAZOLAM HCL 5 MG/5ML IJ SOLN
INTRAMUSCULAR | Status: DC | PRN
  Administered 2011-10-26: 2 mg via INTRAVENOUS

## 2011-10-26 MED ORDER — MENTHOL 3 MG MT LOZG: 1.0000 | LOZENGE | OROMUCOSAL | Status: DC | PRN

## 2011-10-26 MED ORDER — LIDOCAINE HCL (CARDIAC) 20 MG/ML IV SOLN
INTRAVENOUS | Status: AC
  Filled 2011-10-26: qty 5

## 2011-10-26 SURGICAL SUPPLY — 36 items
ADH SKN CLS APL DERMABOND .7 (GAUZE/BANDAGES/DRESSINGS) ×1
CABLE HIGH FREQUENCY MONO STRZ (ELECTRODE) IMPLANT
CATH ROBINSON RED A/P 16FR (CATHETERS) ×1 IMPLANT
CLOTH BEACON ORANGE TIMEOUT ST (SAFETY) ×2 IMPLANT
CONT PATH 16OZ SNAP LID 3702 (MISCELLANEOUS) ×2 IMPLANT
COVER TABLE BACK 60X90 (DRAPES) ×2 IMPLANT
DECANTER SPIKE VIAL GLASS SM (MISCELLANEOUS) ×2 IMPLANT
DERMABOND ADVANCED (GAUZE/BANDAGES/DRESSINGS) ×1
DERMABOND ADVANCED .7 DNX12 (GAUZE/BANDAGES/DRESSINGS) ×2 IMPLANT
ELECT LIGASURE LONG (ELECTRODE) ×2 IMPLANT
ELECT REM PT RETURN 9FT ADLT (ELECTROSURGICAL) ×2
ELECTRODE REM PT RTRN 9FT ADLT (ELECTROSURGICAL) ×1 IMPLANT
GLOVE BIO SURGEON STRL SZ7 (GLOVE) ×6 IMPLANT
GLOVE BIOGEL PI IND STRL 6.5 (GLOVE) ×1 IMPLANT
GLOVE BIOGEL PI INDICATOR 6.5 (GLOVE) ×1
GOWN PREVENTION PLUS LG XLONG (DISPOSABLE) ×9 IMPLANT
NDL INSUFFLATION 14GA 120MM (NEEDLE) ×1 IMPLANT
NEEDLE INSUFFLATION 14GA 120MM (NEEDLE) ×2 IMPLANT
NS IRRIG 1000ML POUR BTL (IV SOLUTION) ×2 IMPLANT
PACK LAVH (CUSTOM PROCEDURE TRAY) ×2 IMPLANT
PROTECTOR NERVE ULNAR (MISCELLANEOUS) ×2 IMPLANT
SEALER TISSUE G2 CVD JAW 45CM (ENDOMECHANICALS) ×2 IMPLANT
SET IRRIG TUBING LAPAROSCOPIC (IRRIGATION / IRRIGATOR) ×1 IMPLANT
SOLUTION ELECTROLUBE (MISCELLANEOUS) IMPLANT
STRIP CLOSURE SKIN 1/4X3 (GAUZE/BANDAGES/DRESSINGS) IMPLANT
SUT MON AB 2-0 CT1 36 (SUTURE) ×4 IMPLANT
SUT VIC AB 0 CT1 18XCR BRD8 (SUTURE) ×3 IMPLANT
SUT VIC AB 0 CT1 8-18 (SUTURE) ×6
SUT VICRYL 0 TIES 12 18 (SUTURE) ×2 IMPLANT
SUT VICRYL 4-0 PS2 18IN ABS (SUTURE) ×2 IMPLANT
TOWEL OR 17X24 6PK STRL BLUE (TOWEL DISPOSABLE) ×4 IMPLANT
TRAY FOLEY CATH 14FR (SET/KITS/TRAYS/PACK) ×2 IMPLANT
TROCAR Z-THREAD BLADED 11X100M (TROCAR) ×2 IMPLANT
TROCAR Z-THREAD BLADED 5X100MM (TROCAR) ×4 IMPLANT
WARMER LAPAROSCOPE (MISCELLANEOUS) ×2 IMPLANT
WATER STERILE IRR 1000ML POUR (IV SOLUTION) ×1 IMPLANT

## 2011-10-26 NOTE — Progress Notes (Signed)
The patient was re-examined with no change in status 

## 2011-10-26 NOTE — Transfer of Care (Signed)
Immediate Anesthesia Transfer of Care Note  Patient: Deborah Wolfe  Procedure(s) Performed: Procedure(s) (LRB) with comments: LAPAROSCOPIC ASSISTED VAGINAL HYSTERECTOMY (N/A)  Patient Location: PACU  Anesthesia Type: General  Level of Consciousness: awake, alert  and oriented  Airway & Oxygen Therapy: Patient Spontanous Breathing and Patient connected to nasal cannula oxygen  Post-op Assessment: Report given to PACU RN and Post -op Vital signs reviewed and stable  Post vital signs: Reviewed and stable  Complications: No apparent anesthesia complications

## 2011-10-26 NOTE — Anesthesia Preprocedure Evaluation (Signed)
Anesthesia Evaluation  Patient identified by MRN, date of birth, ID band Patient awake    Reviewed: Allergy & Precautions, H&P , NPO status , Patient's Chart, lab work & pertinent test results, reviewed documented beta blocker date and time   History of Anesthesia Complications (+) PONV  Airway Mallampati: II TM Distance: >3 FB Neck ROM: full    Dental  (+) Teeth Intact   Pulmonary neg pulmonary ROS,  breath sounds clear to auscultation  Pulmonary exam normal       Cardiovascular hypertension, On Medications and On Home Beta Blockers Rhythm:regular Rate:Normal     Neuro/Psych  Headaches (on topamax for migraines), vertigo 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, 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), Residual Symptoms negative psych ROS   GI/Hepatic negative GI ROS, Neg liver ROS,   Endo/Other  negative endocrine ROS  Renal/GU      Musculoskeletal   Abdominal   Peds  Hematology negative hematology ROS (+)   Anesthesia Other Findings CAUTION POSITIONING RIGHT ELBOW AND RIGHT KNEE - pain and weakness in both since last TIA in April  Reproductive/Obstetrics negative OB ROS                           Anesthesia Physical Anesthesia Plan  ASA: III  Anesthesia Plan: General ETT   Post-op Pain Management:    Induction:   Airway Management Planned:   Additional Equipment:   Intra-op Plan:   Post-operative Plan:   Informed Consent: I have reviewed the patients History and Physical, chart, labs and discussed the procedure including the risks, benefits and alternatives for the proposed anesthesia with the patient or authorized representative who has indicated his/her understanding and acceptance.   Dental Advisory Given  Plan Discussed with: CRNA and Surgeon  Anesthesia Plan Comments:         Anesthesia Quick Evaluation

## 2011-10-26 NOTE — Anesthesia Procedure Notes (Signed)
Procedure Name: Intubation Date/Time: 10/26/2011 7:36 AM Performed by: Shanon Payor Pre-anesthesia Checklist: Suction available, Emergency Drugs available, Timeout performed, Patient identified and Patient being monitored Patient Re-evaluated:Patient Re-evaluated prior to inductionOxygen Delivery Method: Circle system utilized Preoxygenation: Pre-oxygenation with 100% oxygen Intubation Type: IV induction Ventilation: Mask ventilation without difficulty Laryngoscope Size: Mac and 3 Grade View: Grade II Tube type: Oral Tube size: 7.0 mm Number of attempts: 1 Airway Equipment and Method: Stylet Placement Confirmation: ETT inserted through vocal cords under direct vision,  positive ETCO2 and breath sounds checked- equal and bilateral Secured at: 21 cm Tube secured with: Tape Dental Injury: Teeth and Oropharynx as per pre-operative assessment

## 2011-10-26 NOTE — Anesthesia Postprocedure Evaluation (Signed)
Anesthesia Post Note  Patient: Deborah Wolfe  Procedure(s) Performed: Procedure(s) (LRB): LAPAROSCOPIC ASSISTED VAGINAL HYSTERECTOMY (N/A)  Anesthesia type: General  Patient location: PACU  Post pain: Pain level controlled  Post assessment: Post-op Vital signs reviewed  Last Vitals:  Filed Vitals:   10/26/11 0926  BP:   Pulse: 65  Temp:   Resp: 16    Post vital signs: Reviewed  Level of consciousness: sedated  Complications: No apparent anesthesia complicationsfj

## 2011-10-26 NOTE — Anesthesia Postprocedure Evaluation (Signed)
  Anesthesia Post-op Note  Patient: Deborah Wolfe  Procedure(s) Performed: Procedure(s) (LRB) with comments: LAPAROSCOPIC ASSISTED VAGINAL HYSTERECTOMY (N/A)  Patient Location: [facility]  Anesthesia Type: General  Level of Consciousness: sedated  Airway and Oxygen Therapy: Patient Spontanous Breathing  Post-op Pain: mild  Post-op Assessment: Post-op Vital signs reviewed  Post-op Vital Signs: Reviewed and stable  Complications: No apparent anesthesia complications

## 2011-10-26 NOTE — Addendum Note (Signed)
Addendum  created 10/26/11 1814 by Algis Greenhouse, CRNA   Modules edited:Notes Section

## 2011-10-26 NOTE — Op Note (Signed)
Preoperative diagnosis: Dysmenorrhea, menorrhagia  Postoperative diagnosis: Same, possible adenomyosis  Surgeon: Marcelle Overlie  Assistant: Morris  EBL: 150 cc  Specimens removed: Uterus and cervix, to pathology  Procedure and findings:  The patient taken the operating room after an adequate level of general anesthesia was obtained the patient's leg stirrups the abdomen perineum and vagina were prepped and draped in usual fashion for LAVH prior to prep appropriate timeout for taken. The bladder was drained he UA carried out uterus was midposition mobile normal size adnexa negative. Hulka tenaculum was positioned. Attention directed to the abdomen the subumbilical area was infiltrated with quarter percent Marcaine plain, small incision was made in the varies needle was introduced without difficulty. Its intra-abdominal position was verified by pressure water testing. After 2-1/2 L pneumoperitoneum syncopated lap scopic trocar and sleeve were then inserted without difficulty. There was no evidence of any bleeding or trauma 3 finger restaurant symphysis in the midline a 5 mm trocar was inserted under direct visualization and the pelvic findings as follows:  Uterus itself is upper limit normal size somewhat boggy at the fundus bilateral adnexa unremarkable there was a solitary omental adhesion into the area the right adnexa which was coagulated and divided an avascular plane. The upper abdomen was otherwise unremarkable using the in seal device the utero-ovarian pedicle on each side was coagulated and divided down to and including the round ligament on each side with excellent hemostasis. Attention directed to the vaginal portion the procedure at this point.  The legs were extended weighted speculum was positioned the cervical vaginal mucosa was incised with the Bovie posterior culdotomy performed without difficulty. Bladder was advanced superiorly until the anterior peritoneal reflection could be identified  this was entered sharply and a retractor was used to gently elevate the bladder out of the field in sequential manner, staying close to the uterus, the uterosacral ligament, cardinal ligament uterine basket or pedicles and upper broad ligament pedicles were coagulated and divided with the LigaSure device. The fundus of the uterus is in delivered posteriorly remaining pedicles were coagulated and divided. Vaginal cuff was then closed with 3 to 9:00 with a locked suture 2-0 Monocryl. Prior to closure sponge denies precast approach correct x2 vaginal mucosa was then closed right to left with interrupted 2-0 Vicryl sutures with excellent hemostasis. Foley catheter positioned draining clear urine.  Repeat laparoscopy was carried out at reduced pressure, nasi used to irrigate the pelvis which was aspirated noted be hemostatic at the operative site. Its was removed, gas less escape because closed with 4 Monocryl subcuticular at the umbilicus and Dermabond in the lower incision she tolerated this well went to recovery room in good condition.  Dictated with [facility]  Caetano Oberhaus M. Milana Obey.D.

## 2011-10-27 ENCOUNTER — Encounter (HOSPITAL_COMMUNITY): Payer: Self-pay | Admitting: Obstetrics and Gynecology

## 2011-10-27 LAB — CBC
MCH: 29.3 pg (ref 26.0–34.0)
MCHC: 33.4 g/dL (ref 30.0–36.0)
MCV: 87.7 fL (ref 78.0–100.0)
Platelets: 266 10*3/uL (ref 150–400)
RBC: 3.89 MIL/uL (ref 3.87–5.11)

## 2011-10-27 MED ORDER — TRAMADOL HCL 50 MG PO TABS: 50.0000 mg | ORAL_TABLET | Freq: Four times a day (QID) | ORAL | Status: AC | PRN

## 2011-10-27 MED ORDER — SODIUM CHLORIDE 0.9 % IJ SOLN: 3.0000 mL | Freq: Two times a day (BID) | INTRAMUSCULAR | Status: DC

## 2011-10-27 NOTE — Discharge Summary (Signed)
Physician Discharge Summary  Patient ID: Deborah Wolfe MRN: [id number] DOB/AGE: [birth year] 46 y.o.  Admit date: 10/26/2011 Discharge date: 10/27/2011  Admission Diagnoses:Menorrhagia  Discharge Diagnoses: Same Active Problems:  * No active hospital problems. *    Discharged Condition: good  Hospital Course: adm for LAVH, DC on POD #1, afeb, tol PO  Consults: None  Significant Diagnostic Studies: labs:  Results for orders placed during the hospital encounter of 10/26/11 (from the past 24 hour(s))  CBC     Status: Abnormal   Collection Time   10/27/11  5:05 AM      Component Value Range   WBC 11.4 (*) 4.0 - 10.5 K/uL   RBC 3.89  3.87 - 5.11 MIL/uL   Hemoglobin 11.4 (*) 12.0 - 15.0 g/dL   HCT 40.9 (*) 81.1 - 91.4 %   MCV 87.7  78.0 - 100.0 fL   MCH 29.3  26.0 - 34.0 pg   MCHC 33.4  30.0 - 36.0 g/dL   RDW 78.2  95.6 - 21.3 %   Platelets 266  150 - 400 K/uL    Treatments: surgery: LAVH  Discharge Exam: Blood pressure 97/62, pulse 64, temperature 97.3 F (36.3 C), temperature source Oral, resp. rate 17, height 5\' 2"  (1.575 m), weight 154 lb (69.854 kg), last menstrual period 10/05/2011, SpO2 100.00%. abd soft, flat +BS, INCS c/d  Disposition: 01-Home or Self Care     Medication List     As of 10/27/2011  8:40 AM    TAKE these medications         ALPRAZolam 0.5 MG tablet   Commonly known as: XANAX   Take 0.25 mg by mouth as needed. Takes prior to procedures      aspirin 81 MG EC tablet   Take 81 mg by mouth daily. Swallow whole.      ibuprofen 800 MG tablet   Commonly known as: ADVIL,MOTRIN   Take 800 mg by mouth every 6 (six) hours as needed. For cramps or headache      pravastatin 40 MG tablet   Commonly known as: PRAVACHOL   Take 40 mg by mouth daily.      propranolol 40 MG tablet   Commonly known as: INDERAL   Take 40 mg by mouth 2 (two) times daily.      topiramate 100 MG tablet   Commonly known as: TOPAMAX   Take 100 mg by mouth at bedtime.      traMADol 50 MG tablet   Commonly known as: ULTRAM   Take 1 tablet (50 mg total) by mouth every 6 (six) hours as needed for pain.      verapamil 80 MG tablet   Commonly known as: CALAN   Take 40 mg by mouth 3 (three) times daily.      Vitamin D 1000 UNITS capsule   Take 2,000 Units by mouth daily.           Follow-up Information    Follow up with [facility] [place], [place].. Schedule an appointment as soon as possible for a visit in 1 week.   Contact information:   [address] [place] [address] [phone]         Signed: Meriel Pica 10/27/2011, 8:40 AM

## 2011-10-27 NOTE — Progress Notes (Signed)
POD #1  S// no c/o, ready for D/C  O//  BP 97/62  Pulse 64  Temp 97.3 F (36.3 C) (Oral)  Resp 17  Ht 5\' 2"  (1.575 m)  Wt 154 lb (69.854 kg)  BMI 28.17 kg/m2  SpO2 100%  LMP 10/05/2011 CBC    Component Value Date/Time   WBC 11.4* 10/27/2011 0505   RBC 3.89 10/27/2011 0505   HGB 11.4* 10/27/2011 0505   HCT 34.1* 10/27/2011 0505   PLT 266 10/27/2011 0505   MCV 87.7 10/27/2011 0505   MCH 29.3 10/27/2011 0505   MCHC 33.4 10/27/2011 0505   RDW 12.4 10/27/2011 0505   abd soft , flat + BS  A+P// stable , ready for DC

## 2012-04-26 ENCOUNTER — Telehealth: Payer: Self-pay

## 2012-04-26 MED ORDER — PRAVASTATIN SODIUM 40 MG PO TABS: 40.0000 mg | ORAL_TABLET | Freq: Every day | ORAL | Status: AC

## 2012-04-26 MED ORDER — VERAPAMIL HCL 80 MG PO TABS: 40.0000 mg | ORAL_TABLET | Freq: Three times a day (TID) | ORAL | Status: AC

## 2012-04-26 MED ORDER — PROPRANOLOL HCL 40 MG PO TABS: 40.0000 mg | ORAL_TABLET | Freq: Two times a day (BID) | ORAL | Status: AC

## 2012-04-26 MED ORDER — TOPIRAMATE 100 MG PO TABS: 100.0000 mg | ORAL_TABLET | Freq: Every day | ORAL | Status: AC

## 2012-04-26 NOTE — Telephone Encounter (Signed)
Former [facility] patient.  Requesting refills be sent to [facility] in [place].  Rx's sent via [facility].

## 2013-11-02 ENCOUNTER — Other Ambulatory Visit: Payer: Self-pay | Admitting: Obstetrics and Gynecology

## 2013-11-02 DIAGNOSIS — R928 Other abnormal and inconclusive findings on diagnostic imaging of breast: Secondary | ICD-10-CM

## 2013-11-27 ENCOUNTER — Ambulatory Visit
Admission: RE | Admit: 2013-11-27 | Discharge: 2013-11-27 | Disposition: A | Discharge status: Home / Self Care | Payer: BC Managed Care – PPO | Source: Ambulatory Visit | Attending: Obstetrics and Gynecology | Admitting: Obstetrics and Gynecology

## 2013-11-27 ENCOUNTER — Other Ambulatory Visit: Payer: Self-pay | Admitting: Obstetrics and Gynecology

## 2013-11-27 DIAGNOSIS — R928 Other abnormal and inconclusive findings on diagnostic imaging of breast: Secondary | ICD-10-CM

## 2013-11-27 DIAGNOSIS — N6320 Unspecified lump in the left breast, unspecified quadrant: Secondary | ICD-10-CM

## 2013-12-07 ENCOUNTER — Ambulatory Visit
Admission: RE | Admit: 2013-12-07 | Discharge: 2013-12-07 | Disposition: A | Discharge status: Home / Self Care | Payer: BC Managed Care – PPO | Source: Ambulatory Visit | Attending: Obstetrics and Gynecology | Admitting: Obstetrics and Gynecology

## 2013-12-07 ENCOUNTER — Other Ambulatory Visit: Payer: Self-pay | Admitting: Obstetrics and Gynecology

## 2013-12-07 DIAGNOSIS — N6320 Unspecified lump in the left breast, unspecified quadrant: Secondary | ICD-10-CM

## 2013-12-07 HISTORY — PX: BREAST BIOPSY: SHX20

## 2014-04-25 ENCOUNTER — Other Ambulatory Visit: Payer: Self-pay | Admitting: Obstetrics and Gynecology

## 2014-04-27 LAB — CYTOLOGY - PAP

## 2020-12-23 ENCOUNTER — Other Ambulatory Visit: Payer: Self-pay | Admitting: Obstetrics and Gynecology

## 2020-12-23 DIAGNOSIS — R928 Other abnormal and inconclusive findings on diagnostic imaging of breast: Secondary | ICD-10-CM

## 2021-02-18 ENCOUNTER — Other Ambulatory Visit: Payer: Self-pay | Admitting: Obstetrics and Gynecology

## 2021-02-18 ENCOUNTER — Ambulatory Visit
Admission: RE | Admit: 2021-02-18 | Discharge: 2021-02-18 | Disposition: A | Discharge status: Home / Self Care | Payer: Medicare (Managed Care) | Source: Ambulatory Visit | Attending: Obstetrics and Gynecology | Admitting: Obstetrics and Gynecology

## 2021-02-18 ENCOUNTER — Ambulatory Visit
Admission: RE | Admit: 2021-02-18 | Discharge: 2021-02-18 | Disposition: A | Discharge status: Home / Self Care | Payer: Self-pay | Source: Ambulatory Visit | Attending: Obstetrics and Gynecology | Admitting: Obstetrics and Gynecology

## 2021-02-18 DIAGNOSIS — R928 Other abnormal and inconclusive findings on diagnostic imaging of breast: Secondary | ICD-10-CM

## 2021-02-18 DIAGNOSIS — N6320 Unspecified lump in the left breast, unspecified quadrant: Secondary | ICD-10-CM

## 2021-02-18 DIAGNOSIS — N6489 Other specified disorders of breast: Secondary | ICD-10-CM

## 2021-05-06 ENCOUNTER — Other Ambulatory Visit: Payer: Self-pay | Admitting: Obstetrics and Gynecology

## 2021-05-06 DIAGNOSIS — N6489 Other specified disorders of breast: Secondary | ICD-10-CM

## 2021-05-06 DIAGNOSIS — N6320 Unspecified lump in the left breast, unspecified quadrant: Secondary | ICD-10-CM

## 2021-05-06 DIAGNOSIS — R928 Other abnormal and inconclusive findings on diagnostic imaging of breast: Secondary | ICD-10-CM

## 2021-05-15 ENCOUNTER — Other Ambulatory Visit: Payer: Self-pay | Admitting: Obstetrics and Gynecology

## 2021-05-15 ENCOUNTER — Ambulatory Visit
Admission: RE | Admit: 2021-05-15 | Discharge: 2021-05-15 | Disposition: A | Discharge status: Home / Self Care | Payer: Medicare (Managed Care) | Source: Ambulatory Visit | Attending: Obstetrics and Gynecology | Admitting: Obstetrics and Gynecology

## 2021-05-15 ENCOUNTER — Ambulatory Visit
Admission: RE | Admit: 2021-05-15 | Discharge: 2021-05-15 | Disposition: A | Discharge status: Home / Self Care | Payer: Medicaid Other | Source: Ambulatory Visit | Attending: Obstetrics and Gynecology | Admitting: Obstetrics and Gynecology

## 2021-05-15 DIAGNOSIS — N6489 Other specified disorders of breast: Secondary | ICD-10-CM

## 2021-05-15 DIAGNOSIS — N6320 Unspecified lump in the left breast, unspecified quadrant: Secondary | ICD-10-CM

## 2021-05-15 DIAGNOSIS — R928 Other abnormal and inconclusive findings on diagnostic imaging of breast: Secondary | ICD-10-CM

## 2021-05-15 HISTORY — PX: BREAST BIOPSY: SHX20

## 2021-05-26 ENCOUNTER — Ambulatory Visit
Admission: RE | Admit: 2021-05-26 | Discharge: 2021-05-26 | Disposition: A | Discharge status: Home / Self Care | Payer: 59 | Source: Ambulatory Visit | Attending: Obstetrics and Gynecology | Admitting: Obstetrics and Gynecology

## 2021-05-26 ENCOUNTER — Ambulatory Visit
Admission: RE | Admit: 2021-05-26 | Discharge: 2021-05-26 | Disposition: A | Discharge status: Home / Self Care | Payer: Medicare (Managed Care) | Source: Ambulatory Visit | Attending: Obstetrics and Gynecology | Admitting: Obstetrics and Gynecology

## 2021-05-26 DIAGNOSIS — N6320 Unspecified lump in the left breast, unspecified quadrant: Secondary | ICD-10-CM

## 2021-05-26 HISTORY — PX: BREAST BIOPSY: SHX20

## 2021-08-25 ENCOUNTER — Other Ambulatory Visit: Payer: Medicare (Managed Care)

## 2021-12-23 ENCOUNTER — Other Ambulatory Visit: Payer: 59

## 2021-12-31 ENCOUNTER — Other Ambulatory Visit: Payer: 59

## 2022-01-29 ENCOUNTER — Ambulatory Visit
Admission: RE | Admit: 2022-01-29 | Discharge: 2022-01-29 | Disposition: A | Discharge status: Home / Self Care | Payer: 59 | Source: Ambulatory Visit | Attending: Obstetrics and Gynecology | Admitting: Obstetrics and Gynecology

## 2022-01-29 DIAGNOSIS — N6489 Other specified disorders of breast: Secondary | ICD-10-CM

## 2022-01-29 DIAGNOSIS — R928 Other abnormal and inconclusive findings on diagnostic imaging of breast: Secondary | ICD-10-CM

## 2022-01-29 DIAGNOSIS — N6320 Unspecified lump in the left breast, unspecified quadrant: Secondary | ICD-10-CM

## 2024-03-31 IMAGING — MG MM BREAST LOCALIZATION CLIP
4 series · 4 of 12 positions shown · non-contrast
Comparison: Previous exam(s).

CLINICAL DATA: Patient status post stereo biopsy left breast mass.

EXAM:
3D DIAGNOSTIC LEFT MAMMOGRAM POST STEREOTACTIC BIOPSY

[L ML synth-2D]
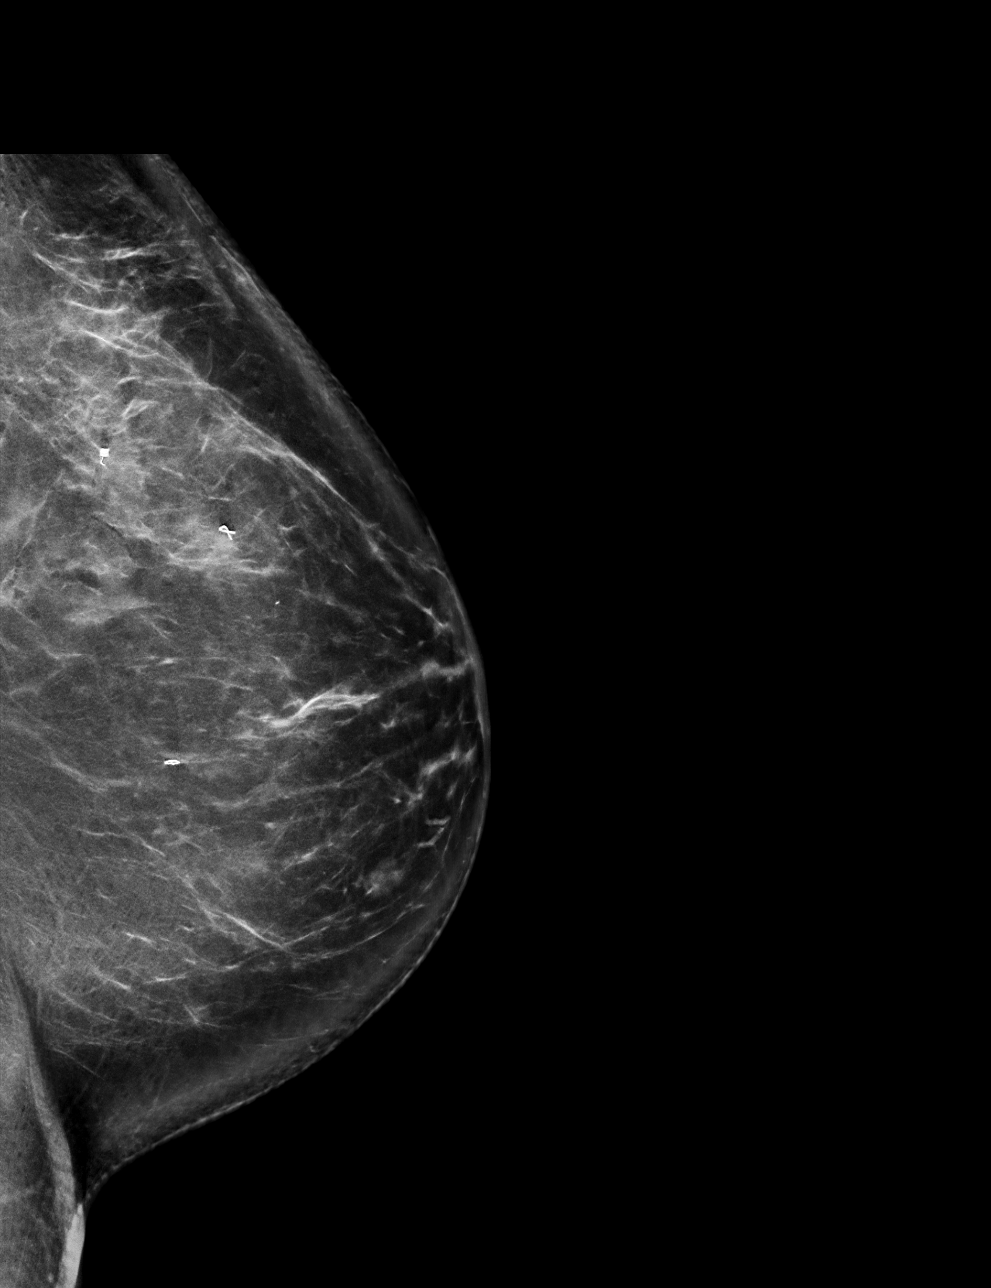

[L CC synth-2D]
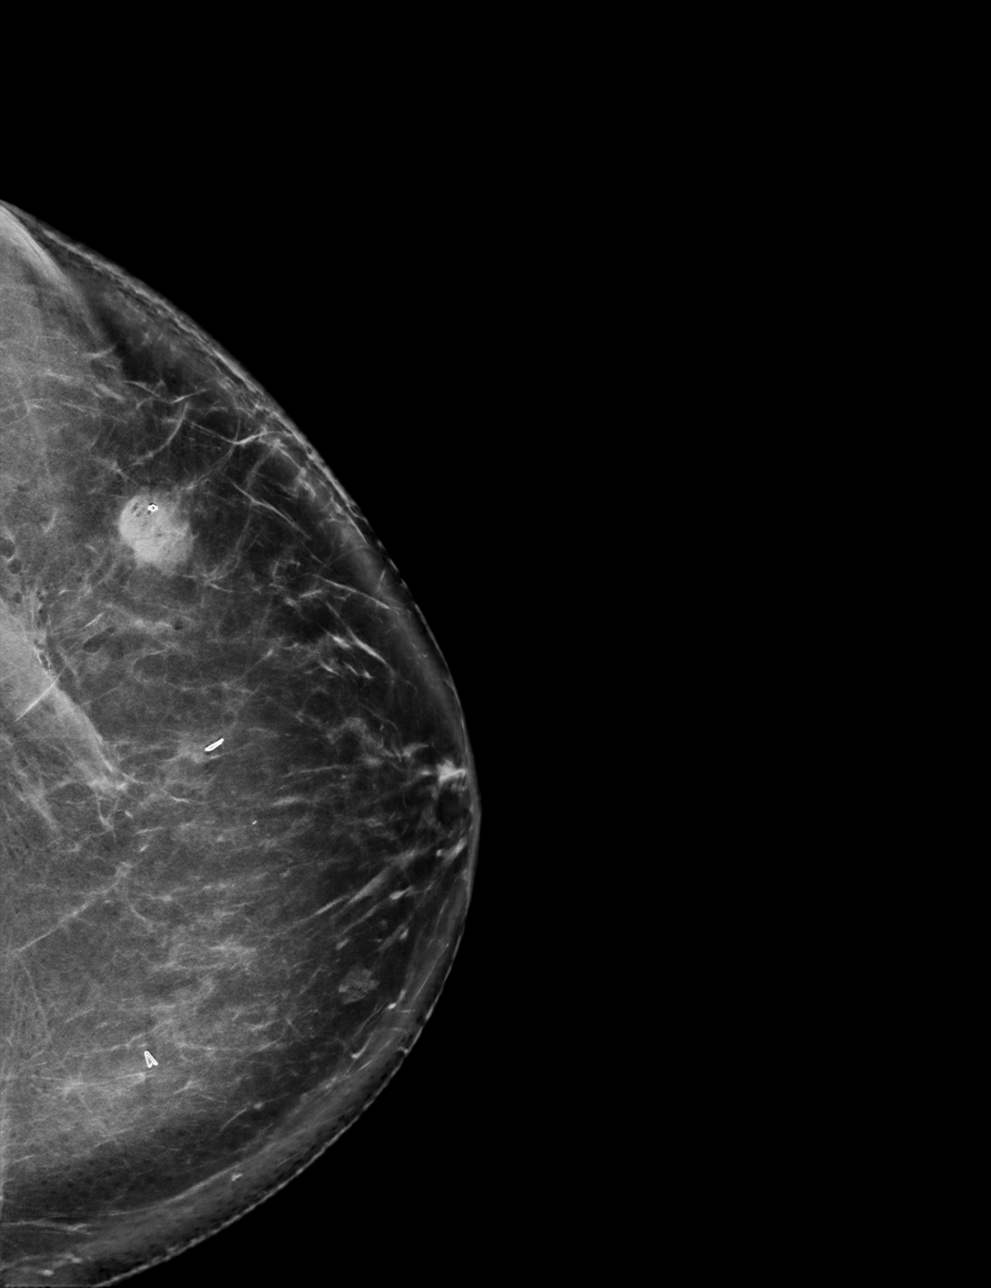

[L CC tomo · tomo slice 45/88.0]
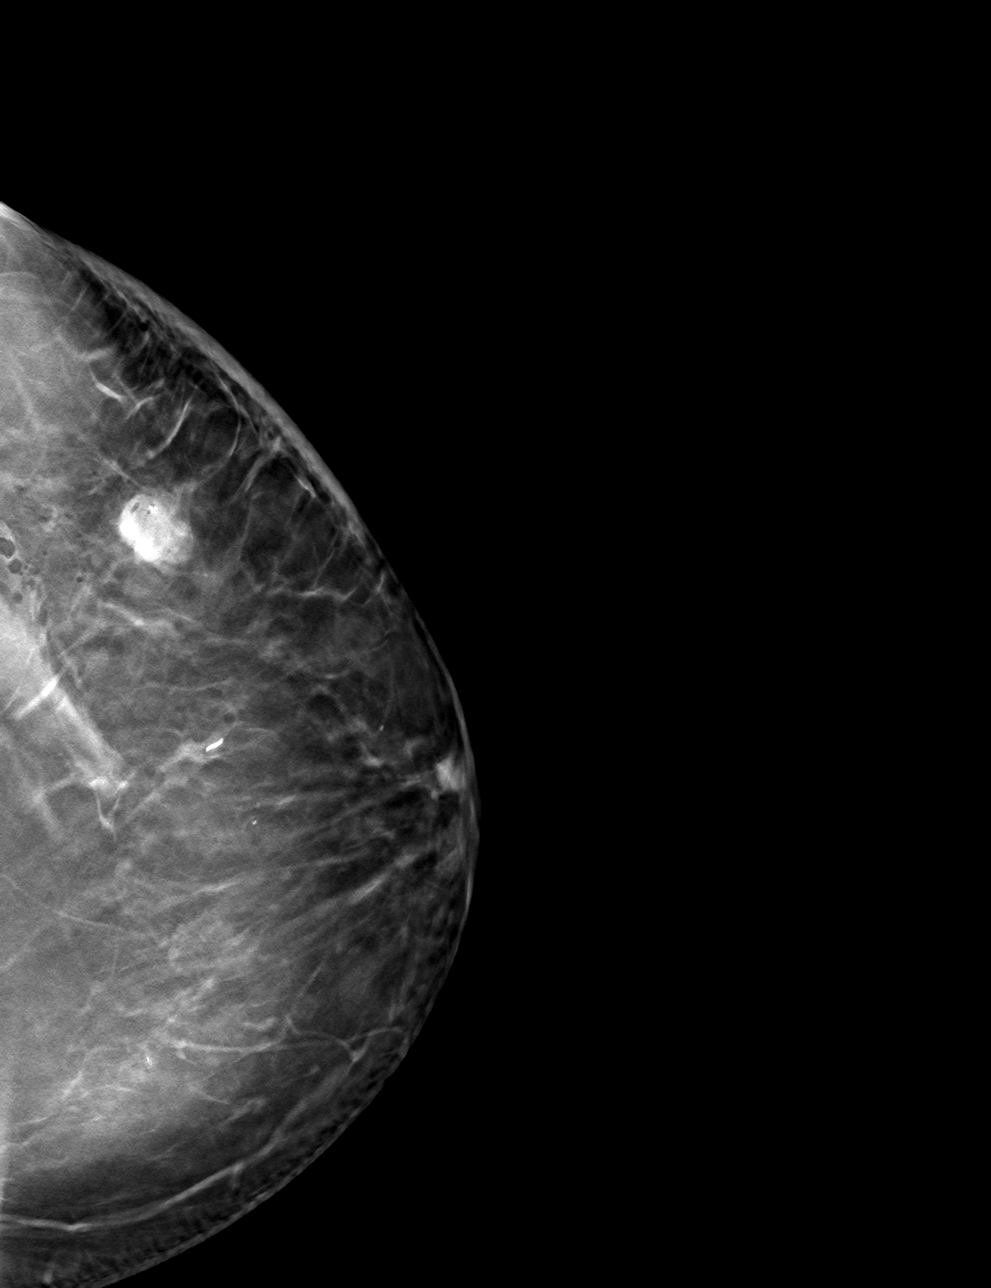

[L ML tomo · tomo slice 44/87.0]
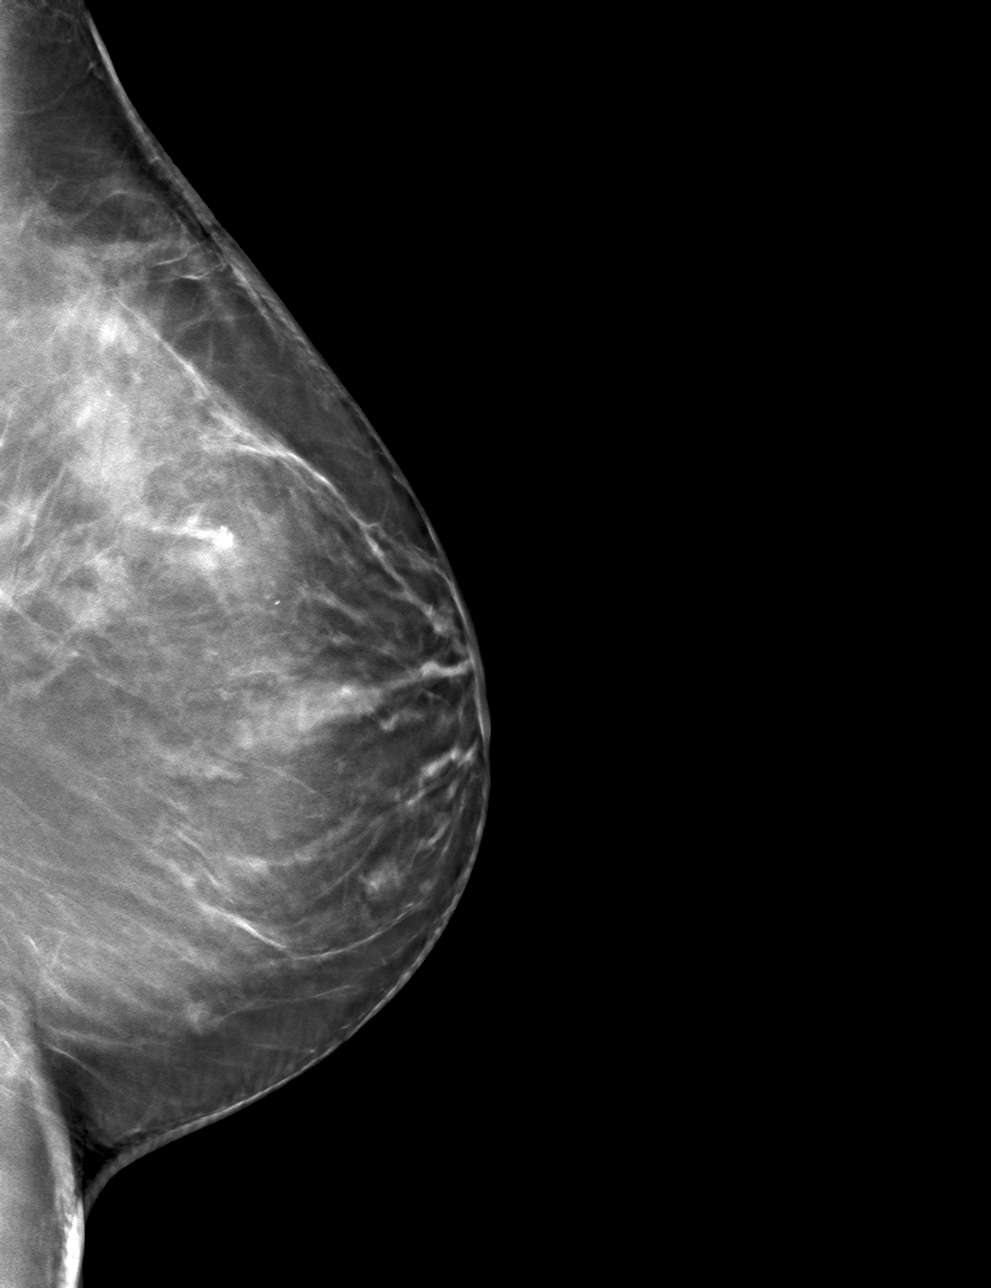

[4 of 12 positions shown; findings below may reference images not displayed]

FINDINGS: 3D Mammographic images were obtained following stereotactic guided
biopsy of left breast mass. The biopsy marking clip is in expected
position at the site of biopsy.
IMPRESSION: Appropriate positioning of the coil shaped biopsy marking clip at
the site of biopsy in the upper-outer left breast.

Final Assessment: Post Procedure Mammograms for Marker Placement
